# Patient Record
Sex: Male | Born: 1955 | Race: Black or African American | Hispanic: No | Marital: Married | Smoking: Never smoker
Health system: Southern US, Community
[De-identification: ages and names within clinical notes are randomized; demographics above are authoritative.]

## PROBLEM LIST (undated history)

## (undated) DIAGNOSIS — R7303 Prediabetes: Secondary | ICD-10-CM

## (undated) DIAGNOSIS — K429 Umbilical hernia without obstruction or gangrene: Secondary | ICD-10-CM

## (undated) DIAGNOSIS — G4730 Sleep apnea, unspecified: Secondary | ICD-10-CM

## (undated) DIAGNOSIS — Z973 Presence of spectacles and contact lenses: Secondary | ICD-10-CM

## (undated) DIAGNOSIS — H40009 Preglaucoma, unspecified, unspecified eye: Secondary | ICD-10-CM

## (undated) DIAGNOSIS — S3992XA Unspecified injury of lower back, initial encounter: Secondary | ICD-10-CM

## (undated) DIAGNOSIS — E7800 Pure hypercholesterolemia, unspecified: Secondary | ICD-10-CM

## (undated) DIAGNOSIS — B54 Unspecified malaria: Secondary | ICD-10-CM

## (undated) DIAGNOSIS — C61 Malignant neoplasm of prostate: Secondary | ICD-10-CM

## (undated) HISTORY — PX: PROSTATE BIOPSY: SHX241

---

## 2011-03-23 ENCOUNTER — Ambulatory Visit
Admission: RE | Admit: 2011-03-23 | Discharge: 2011-03-23 | Disposition: A | Discharge status: Home / Self Care | Payer: BC Managed Care – PPO | Source: Ambulatory Visit | Attending: Family Medicine | Admitting: Family Medicine

## 2011-03-23 ENCOUNTER — Other Ambulatory Visit: Payer: Self-pay | Admitting: Family Medicine

## 2011-03-23 DIAGNOSIS — M654 Radial styloid tenosynovitis [de Quervain]: Secondary | ICD-10-CM

## 2016-06-17 ENCOUNTER — Other Ambulatory Visit: Payer: Self-pay | Admitting: Family Medicine

## 2016-06-17 ENCOUNTER — Ambulatory Visit
Admission: RE | Admit: 2016-06-17 | Discharge: 2016-06-17 | Disposition: A | Discharge status: Home / Self Care | Payer: BC Managed Care – PPO | Source: Ambulatory Visit | Attending: Family Medicine | Admitting: Family Medicine

## 2016-06-17 DIAGNOSIS — R059 Cough, unspecified: Secondary | ICD-10-CM

## 2016-06-17 DIAGNOSIS — R05 Cough: Secondary | ICD-10-CM

## 2019-06-09 ENCOUNTER — Ambulatory Visit: Payer: BC Managed Care – PPO | Attending: Internal Medicine

## 2019-06-09 DIAGNOSIS — Z23 Encounter for immunization: Secondary | ICD-10-CM | POA: Insufficient documentation

## 2019-06-09 NOTE — Progress Notes (Signed)
   Covid-19 Vaccination Clinic  Name:  Elye Harmsen    MRN: [id number] DOB: 1955/05/04  06/09/2019  Mr. Folker was observed post Covid-19 immunization for 15 minutes without incidence. He was provided with Vaccine Information Sheet and instruction to access the V-Safe system.   Mr. Laura was instructed to call 911 with any severe reactions post vaccine: Marland Kitchen Difficulty breathing  . Swelling of your face and throat  . A fast heartbeat  . A bad rash all over your body  . Dizziness and weakness    Immunizations Administered    Name Date Dose VIS Date Route   Pfizer COVID-19 Vaccine 06/09/2019  6:04 PM 0.3 mL 03/23/2019 Intramuscular   Manufacturer: [employer], [employer]   Lot: [id number]   NDC: [address]-0

## 2019-06-30 ENCOUNTER — Ambulatory Visit: Payer: BC Managed Care – PPO | Attending: Internal Medicine

## 2019-06-30 DIAGNOSIS — Z23 Encounter for immunization: Secondary | ICD-10-CM

## 2019-06-30 NOTE — Progress Notes (Signed)
   Covid-19 Vaccination Clinic  Name:  Andrew Clements    MRN: [id number] DOB: 1955/06/25  06/30/2019  Andrew Clements was observed post Covid-19 immunization for 15 minutes without incident. He was provided with Vaccine Information Sheet and instruction to access the V-Safe system.   Andrew Clements was instructed to call 911 with any severe reactions post vaccine: Marland Kitchen Difficulty breathing  . Swelling of face and throat  . A fast heartbeat  . A bad rash all over body  . Dizziness and weakness   Immunizations Administered    Name Date Dose VIS Date Route   Pfizer COVID-19 Vaccine 06/30/2019  4:59 PM 0.3 mL 03/23/2019 Intramuscular   Manufacturer: [employer], [employer]   Lot: [id number]   NDC: [address]-7

## 2020-10-17 ENCOUNTER — Other Ambulatory Visit (HOSPITAL_COMMUNITY): Payer: Self-pay | Admitting: Family Medicine

## 2020-11-06 ENCOUNTER — Other Ambulatory Visit: Payer: Self-pay

## 2020-11-06 ENCOUNTER — Encounter (HOSPITAL_BASED_OUTPATIENT_CLINIC_OR_DEPARTMENT_OTHER): Payer: Self-pay

## 2020-11-06 ENCOUNTER — Ambulatory Visit (HOSPITAL_BASED_OUTPATIENT_CLINIC_OR_DEPARTMENT_OTHER)
Admission: RE | Admit: 2020-11-06 | Discharge: 2020-11-06 | Disposition: A | Discharge status: Home / Self Care | Payer: BC Managed Care – PPO | Source: Ambulatory Visit | Attending: Family Medicine | Admitting: Family Medicine

## 2020-11-06 DIAGNOSIS — Z136 Encounter for screening for cardiovascular disorders: Secondary | ICD-10-CM | POA: Insufficient documentation

## 2022-02-20 ENCOUNTER — Other Ambulatory Visit: Payer: Self-pay | Admitting: Urology

## 2022-02-20 DIAGNOSIS — R972 Elevated prostate specific antigen [PSA]: Secondary | ICD-10-CM

## 2022-04-01 ENCOUNTER — Ambulatory Visit
Admission: RE | Admit: 2022-04-01 | Discharge: 2022-04-01 | Disposition: A | Discharge status: Home / Self Care | Payer: BC Managed Care – PPO | Source: Ambulatory Visit | Attending: Urology | Admitting: Urology

## 2022-04-01 DIAGNOSIS — R972 Elevated prostate specific antigen [PSA]: Secondary | ICD-10-CM

## 2022-04-01 MED ORDER — GADOPICLENOL 0.5 MMOL/ML IV SOLN
7.0000 mL | Freq: Once | INTRAVENOUS | Status: AC | PRN
  Administered 2022-04-01: 7 mL via INTRAVENOUS

## 2022-06-11 DIAGNOSIS — Z8616 Personal history of COVID-19: Secondary | ICD-10-CM

## 2022-06-11 HISTORY — DX: Personal history of COVID-19: Z86.16

## 2022-06-17 ENCOUNTER — Telehealth: Payer: Self-pay

## 2022-06-17 NOTE — Telephone Encounter (Signed)
Left message for patient to return my call in reference to up coming prostate clinic appointment

## 2022-06-23 ENCOUNTER — Telehealth: Payer: Self-pay

## 2022-06-23 NOTE — Telephone Encounter (Signed)
    1. I confirmed with the patient he is aware of his referral to the clinic 3/19 arriving @ 12:45 pm.    2. I discussed the format of the clinic and the physicians he will be seeing that day.   3. I discussed where the clinic is located and how to contact me.   4. I confirmed his address and informed him I would be mailing a packet of information and forms to be completed. I asked him to bring them with him the day of his appointment.    He voiced understanding of the above. I asked him to call me if he has any questions or concerns regarding his appointments or the forms he needs to complete.

## 2022-06-28 NOTE — Progress Notes (Signed)
RN left message for appointment reminder for upcoming [facility] appointment.

## 2022-06-29 ENCOUNTER — Other Ambulatory Visit: Payer: Self-pay

## 2022-06-29 ENCOUNTER — Ambulatory Visit
Admission: RE | Admit: 2022-06-29 | Discharge: 2022-06-29 | Disposition: A | Discharge status: Home / Self Care | Payer: BC Managed Care – PPO | Source: Ambulatory Visit | Attending: Radiation Oncology | Admitting: Radiation Oncology

## 2022-06-29 ENCOUNTER — Encounter: Payer: Self-pay | Admitting: Urology

## 2022-06-29 ENCOUNTER — Encounter: Payer: Self-pay | Admitting: Radiation Oncology

## 2022-06-29 VITALS — BP 155/76 | HR 64 | Temp 97.0°F | Resp 18 | Ht 72.0 in | Wt 189.4 lb

## 2022-06-29 DIAGNOSIS — C61 Malignant neoplasm of prostate: Secondary | ICD-10-CM | POA: Insufficient documentation

## 2022-06-29 HISTORY — DX: Pure hypercholesterolemia, unspecified: E78.00

## 2022-06-29 HISTORY — DX: Prediabetes: R73.03

## 2022-06-29 HISTORY — DX: Sleep apnea, unspecified: G47.30

## 2022-06-29 HISTORY — DX: Umbilical hernia without obstruction or gangrene: K42.9

## 2022-06-29 HISTORY — DX: Preglaucoma, unspecified, unspecified eye: H40.009

## 2022-06-29 HISTORY — DX: Malignant neoplasm of prostate: C61

## 2022-06-29 HISTORY — DX: Unspecified malaria: B54

## 2022-06-29 HISTORY — DX: Unspecified injury of lower back, initial encounter: S39.92XA

## 2022-06-29 NOTE — Consult Note (Signed)
[facility]     06/29/2022   --------------------------------------------------------------------------------   Andrew Clements  MRN: [id number]  DOB: 1955/06/09, 67 year old Male  SSN:    PRIMARY CARE:  Dibas Koirala, MD  PRIMARY CARE FAX:  [phone]  REFERRING:  Lujean Amel, MD  PROVIDER:  Irine Seal, M.D.  TREATING:  Raynelle Bring, M.D.  LOCATION:  [facility] [phone]     --------------------------------------------------------------------------------   CC/HPI: CC: Prostate Cancer   Physician requesting consult: Dr. Irine Seal  PCP: Dr. Lujean Amel  Location of consult: [facility] - Prostate Cancer Multidisciplinary Clinic   Mr. Coenen is a 67 year old gentleman who found to have a rising PSA of 4.5. This prompted an MRI of the prostate on 04/01/22 that demonstrated a 1.4 cm L mid/apical PI-RADS 4 lesion (ROI 1) and a 1.8 cm R mid/apical PI-RADS 4 lesion (ROI 2). He then proceeded with an ExoDx urine test that confirmed a significant risk for clinically significant prostate cancer (36.29 was the result, high normal is 15.6). He proceeded with an MR/US fusion TRUS biopsy on 05/31/22 that confirmed Gleason 3+4=7 adenocarcinoma of the prostate in 3 out of 3 ROI-1 targeted biopsies. ROI-2 was benign. 5 out of the 12 systematic biopsies were positive for Gleason 3+3=6 adenocarcinoma.   Family history: None.   Imaging studies: MRI (04/01/22): No EPE, SVI, LAD, or bone lesions.   PMH: He has a history of diabetes, hyperlipidemia, and sleep apnea.  PSH: No abdominal surgeries. He does have an umbilical hernia and has been considering having this repaired.   TNM stage: cT1c N0 Mx  PSA: 4.5  Gleason score: 3+4=7 (GG 2)  Biopsy (05/31/22): 8/18 cores positive  Left: L lateral apex (20%, 3+3=6), L mid (10%, 3+3=6), L lateral base (5%, 3+3=6)  Right: R lateral apex (5%, 3+3=6), R mid (10%, 3+3=6)  ROI 1: 3/3 cores, 3+4=7 (20%,  20%, 10%)  ROI 2: HGPIN  Prostate volume: 33.9 cc  PSAD: 0.13   Nomogram  OC disease: 57%  EPE: 42%  SVI: 4%  LNI: 4%  PFS (5 year, 10 year): 84%, 73%   Urinary function: IPSS is 12.  Erectile function: SHIM score is 20.     ALLERGIES: Chloroquine Hcl    MEDICATIONS: Alpha Lipoic Acid  Asafetida  Cinnamon  Latanoprost  Levofloxacin 750 mg tablet 1 po 1 hour prior to the procedure  Magnesium  Melatonin  Milk Thistle  Saw Palmetto  Timolol Maleate  Vitamin B Complex  Vitamin B12  Vitamin C  Zinc     GU PSH: Prostate Needle Biopsy - 05/31/2022     NON-GU PSH: Surgical Pathology, Gross And Microscopic Examination For Prostate Needle - 05/31/2022     GU PMH: Abnormal radiologic findings on diagnostic imaging of other urinary organs - 05/31/2022, - 04/28/2022 Elevated PSA - 05/31/2022, He has an elevated PSA with a low f/t ratio and 2 PIRADS 4 lesions in a 66ml prostate. We discussed options but I have strongly recommended that he have an MR fusion biopsy and he is agreeable. I have reviewed the risks of bleeding, infection and voiding difficulty. Levaquin sent. , - 04/28/2022, His PSA was up over 4 but was down with the subsequent draw. I will repeat the PSA today and if it still remains up, I will get an ExoDx test and consider an MRI. If it is down I will see him in 3 months with a PSA. , -  02/15/2022 BPH w/LUTS, His exam is benign. He has mild/mod LUTS with bothersome nocturia. I recommended he cut out coffee to see if that helps. - 02/15/2022 Nocturia - 02/15/2022    NON-GU PMH: Diabetes Type 2 Hypercholesterolemia Sleep Apnea    FAMILY HISTORY: Bone Cancer - Sister Death In The Family Father - Other Death In The Family Mother - Other Diabetes - Brother High Blood Pressure - Sister   SOCIAL HISTORY: Marital Status: Married Preferred Language: English; Ethnicity: Not Hispanic Or Latino; Race: Black or African American Current Smoking Status: Patient has never  smoked.   Tobacco Use Assessment Completed: Used Tobacco in last 30 days? Does not use smokeless tobacco. Has never drank.  Does not use drugs. Drinks 3 caffeinated drinks per day. 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  Gastrointestinal (Upper):   Patient denies nausea and vomiting.  Gastrointestinal (Lower):   Patient denies constipation and diarrhea.  Constitutional:   Patient denies fever, night sweats, weight loss, and fatigue.  Skin:   Patient denies skin rash/ lesion and itching.  Eyes:   Patient denies blurred vision and double vision.  Ears/ Nose/ Throat:   Patient denies sore throat and sinus problems.  Hematologic/Lymphatic:   Patient denies swollen glands and easy bruising.  Cardiovascular:   Patient denies leg swelling and chest pains.  Respiratory:   Patient denies cough and shortness of breath.  Endocrine:   Patient denies excessive thirst.  Musculoskeletal:   Patient denies back pain and joint pain.  Neurological:   Patient denies headaches and dizziness.  Psychologic:   Patient denies depression and anxiety.   VITAL SIGNS: None   MULTI-SYSTEM PHYSICAL EXAMINATION:    Constitutional: Well-nourished. No physical deformities. Normally developed. Good grooming.     Complexity of Data:  Lab Test Review:   PSA  Records Review:   Pathology Reports, Previous Patient Records  X-Ray Review: MRI Prostate GSORAD: Reviewed Films.     05/17/22 02/15/22 01/01/22 11/10/21 10/17/20  PSA  Total PSA 4.50 ng/mL 4.52 ng/mL 3.86 ng/ml 4.23 ng/ml 2.91 ng/ml  Free PSA 0.49 ng/mL 0.36 ng/mL     % Free PSA 11 % PSA 8 % PSA       PROCEDURES: None   ASSESSMENT:      ICD-10 Details  1 GU:    Prostate Cancer - C61    PLAN:           Document Letter(s):  Created for Patient: Clinical Summary         Notes:   1. Favorable intermediate risk prostate cancer: I had a very long detailed discussion with Mr. Klemens and his wife today regarding his prostate cancer situation. He asked numerous questions and we spent a long time reviewing his situation in detail. The patient was counseled about the natural history of prostate cancer and the standard treatment options that are available for prostate cancer. It was explained to him how his age and life expectancy, clinical stage, Gleason score/prognostic grade group, and PSA (and PSA density) affect his prognosis, the decision to proceed with additional staging studies, as well as how that information influences recommended treatment strategies. We discussed the roles for active surveillance, radiation therapy,  surgical therapy, androgen deprivation, as well as ablative therapy and other investigational options for the treatment of prostate cancer as appropriate to his individual cancer situation. We discussed the risks and benefits of these options with regard to their impact on cancer control and also in terms of potential adverse events, complications, and impact on quality of life particularly related to urinary and sexual function. The patient was encouraged to ask questions throughout the discussion today and all questions were answered to his stated satisfaction. In addition, the patient was provided with and/or directed to appropriate resources and literature for further education about prostate cancer and treatment options. We discussed surgical therapy for prostate cancer including the different available surgical approaches. We discussed, in detail, the risks and expectations of surgery with regard to cancer control, urinary control, and erectile function as well as the expected postoperative recovery process. Additional risks of surgery including  but not limited to bleeding, infection, hernia formation, nerve damage, lymphocele formation, bowel/rectal injury potentially necessitating colostomy, damage to the urinary tract resulting in urine leakage, urethral stricture, and the cardiopulmonary risks such as myocardial infarction, stroke, death, venothromboembolism, etc. were explained. The risk of open surgical conversion for robotic/laparoscopic prostatectomy was also discussed.   He is scheduled to meet with Dr. Tammi Klippel later this afternoon to further discuss his radiotherapy options. He appears a bit overwhelmed by his diagnosis and is unsure of how he would like to proceed.   If he elects to consider surgical therapy, I would have him return for a preoperative exam and likely he will have numerous more questions. He also has an umbilical hernia and that he may wish to consider getting repaired at the same time if he does proceed with surgery.   CC: Dr. Lauretta Grill Koirala  Dr. Irine Seal  Dr. Tyler Pita    E & M CODES: We spent 82 minutes dedicated to evaluation and management time, including face to face interaction, discussions on coordination of care, documentation, result review, and discussion with others as applicable.

## 2022-06-29 NOTE — Progress Notes (Signed)
                               Care Plan Summary  Name: Andrew Clements DOB: 1955-04-24   Your Medical Team:   Urologist -  Dr. Raynelle Bring, [facility]  Radiation Oncologist - Dr. Tyler Pita, [place]     Recommendations: 1) Surgery   2) Radiation    * These recommendations are based on information available as of today's consult.      Recommendations may change depending on the results of further tests or exams.    Next Steps: 1) Consider your options.  Reach out to Kathlee Nations, Art gallery manager, with any questions or treatment decision.     When appointments need to be scheduled, you will be contacted by [facility] and/or [facility].  Questions?  Please do not hesitate to call Katheren Puller, BSN, RN at [phone] with any questions or concerns.  Kathlee Nations is your Oncology Nurse Navigator and is available to assist you while you're receiving your medical care at [facility].

## 2022-06-29 NOTE — Progress Notes (Signed)
Radiation Oncology         [phone] ________________________________  Multidisciplinary Prostate Cancer Clinic  Initial Radiation Oncology Consultation  Name: Andrew Clements MRN: [id number]  Date: 06/29/2022  DOB: Oct 22, 1955  [id number], Dibas, MD  Irine Seal, MD   REFERRING PHYSICIAN: Irine Seal, MD  DIAGNOSIS: 67 y.o. gentleman with stage T1c adenocarcinoma of the prostate with a Gleason's score of 3+4 and a PSA of 4.52    ICD-10-CM   1. Malignant neoplasm of prostate ([place])  C61       HISTORY OF PRESENT ILLNESS::Andrew Clements is a 67 y.o. gentleman with prostate cancer, referred by Dr. Jeffie Pollock.  He was noted to have an elevated PSA of 4.23 by his primary care physician, Dr. Dorthy Cooler.  His previous PSA has been 2.91 in July 2022 so a repeat PSA was performed three weeks later for confirmation and this showed a slight decrease to 3.86.  Accordingly, he was referred for evaluation in urology by Dr. Jeffie Pollock on 02/15/22,  digital rectal examination performed at that time showed no nodules. A repeat PSA obtained that day had increased to 4.52, prompting a prostate MRI which was performed on 04/01/22 and showed PI-RADS 4 lesions in the peripheral zone bilaterally. A urine sample was obtained on 05/13/22 and sent for ExoDX testing showing an elevated risk for high-risk prostate cancer.  Therefore, the patient proceeded to MRI fusion biopsy of the prostate on 05/31/22.  The prostate volume measured 33.87 cc.  Out of 18 core biopsies, 8 were positive.  The maximum Gleason score was 3+4, and this was seen in all three samples from ROI #1 (located on the left). Additionally, Gleason 3+3 was seen in left base lateral, left apex lateral, left mid, right mid, and right apex lateral (all involving 20% or less of each core).      The patient reviewed the biopsy results with his urologist and he has kindly been referred today to the multidisciplinary prostate cancer clinic for presentation of pathology and  radiology studies in our conference for discussion of potential radiation treatment options and clinical evaluation.  PREVIOUS RADIATION THERAPY: No  PAST MEDICAL HISTORY:  has a past medical history of Back injury, Borderline glaucoma, High cholesterol, Malaria, Prediabetes, Prostate cancer ([place]), Sleep apnea, and Umbilical hernia.    PAST SURGICAL HISTORY: Past Surgical History:  Procedure Laterality Date   PROSTATE BIOPSY      FAMILY HISTORY: family history includes Bone cancer in his sister.  SOCIAL HISTORY:  reports that he has never smoked. He has never used smokeless tobacco. He reports that he does not drink alcohol and does not use drugs.  ALLERGIES: Chloroquine phosphate  MEDICATIONS:  Current Outpatient Medications  Medication Sig Dispense Refill   OneTouch Delica Lancets [id number] MISC as directed To obtain blood once a day for 90 days     Acetylcysteine (N-ACETYL-L-CYSTEINE PO) 1 capsule with a meal Orally Once a day     ALPHA LIPOIC ACID PO 1 tablet Orally once a day     Ascorbic Acid (VITAMIN C) 500 MG CAPS 1 capsule Orally Once a day     cholecalciferol (VITAMIN D3) 25 MCG (1000 UNIT) tablet 2 tablets Orally Once a day     latanoprost (XALATAN) 0.005 % ophthalmic solution 1 drop daily.     Magnesium 300 MG CAPS 1 capsule with a meal Orally Once a day     Milk Thistle 500 MG CAPS as directed Orally     Multiple Vitamins-Minerals (MULTIVITAMIN ADULT  EXTRA C PO) 1 tablet by mouth Once daily     Saw Palmetto 450 MG CAPS as directed Orally     timolol (TIMOPTIC) 0.5 % ophthalmic solution Place 1 drop into the left eye every morning.     Zinc 100 MG TABS 1 tablet Orally Once a day     No current facility-administered medications for this encounter.    REVIEW OF SYSTEMS:  On review of systems, the patient reports that he is doing well overall. He denies any chest pain, shortness of breath, cough, fevers, chills, night sweats, unintended weight changes. He denies any bowel  disturbances, and denies abdominal pain, nausea or vomiting. He denies any new musculoskeletal or joint aches or pains. His IPSS was 12, indicating mild to moderate urinary symptoms. His SHIM was 20, indicating he has mild erectile dysfunction. A complete review of systems is obtained and is otherwise negative.   PHYSICAL EXAM:  Wt Readings from Last 3 Encounters:  06/29/22 189 lb 6 oz (85.9 kg)   Temp Readings from Last 3 Encounters:  06/29/22 (!) 97 F (36.1 C) (Temporal)   BP Readings from Last 3 Encounters:  06/29/22 (!) 155/76   Pulse Readings from Last 3 Encounters:  06/29/22 64    /10  In general this is a well appearing African-American man in no acute distress. He's alert and oriented x4 and appropriate throughout the examination. Cardiopulmonary assessment is negative for acute distress and he exhibits normal effort.    KPS = 100  100 - Normal; no complaints; no evidence of disease. 90   - Able to carry on normal activity; minor signs or symptoms of disease. 80   - Normal activity with effort; some signs or symptoms of disease. 72   - Cares for self; unable to carry on normal activity or to do active work. 60   - Requires occasional assistance, but is able to care for most of his personal needs. 50   - Requires considerable assistance and frequent medical care. 86   - Disabled; requires special care and assistance. 57   - Severely disabled; hospital admission is indicated although death not imminent. 5   - Very sick; hospital admission necessary; active supportive treatment necessary. 10   - Moribund; fatal processes progressing rapidly. 0     - Dead  Karnofsky DA, Abelmann WH, Craver LS and Burchenal JH [phone]) The use of the nitrogen mustards in the palliative treatment of carcinoma: with particular reference to bronchogenic carcinoma Cancer 1 634-56   LABORATORY DATA:  No results found for: "WBC", "HGB", "HCT", "MCV", "PLT" No results found for: "NA", "K", "CL",  "CO2" No results found for: "ALT", "AST", "GGT", "ALKPHOS", "BILITOT"   RADIOGRAPHY: No results found.    IMPRESSION/PLAN: 67 y.o. gentleman with Stage T1c adenocarcinoma of the prostate with a Gleason score of 3+4 and a PSA of 4.52.    We discussed the patient's workup and outlined the nature of prostate cancer in this setting. The patient's T stage, Gleason's score, and PSA put him into the intermediate risk group. Accordingly, he is eligible for a variety of potential treatment options including active surveillance, brachytherapy, 5.5 weeks of external radiation, or prostatectomy. We discussed the available radiation techniques, and focused on the details and logistics of delivery. We discussed and outlined the risks, benefits, short and long-term effects associated with radiotherapy and compared and contrasted these with prostatectomy. We discussed the role of SpaceOAR gel in reducing the rectal toxicity associated with radiotherapy.  He appears to have a good understanding of his disease and our treatment recommendations which are of curative intent.  He was encouraged to ask questions that were answered to his stated satisfaction.  At the end of the conversation the patient remains undecided regarding his final treatment preference and prefers to take some additional time to consider his options.  He has our contact information and will let us know once he has reached a final decision so that we can proceed with treatment planning accordingly at that time.  We enjoyed meeting him and his wife today and look forward to continuing to participate in his care.  They know that they are welcome to call at anytime with any further questions or concerns related to the radiation treatment options discussed today.  We personally spent 60 minutes in this encounter including chart review, reviewing radiological studies, meeting face-to-face with the patient, entering orders and completing  documentation.    Nicholos Johns, PA-C    Tyler Pita, MD  [facility] Direct Dial: [phone]  Fax: [phone] [email or web address]  Skype  [email or web address]   This document serves as a record of services personally performed by Tyler Pita, MD and Freeman Caldron, PA-C. It was created on their behalf by Wilburn Mylar, a trained medical scribe. The creation of this record is based on the scribe's personal observations and the provider's statements to them. This document has been checked and approved by the attending provider.

## 2022-07-09 IMAGING — CT CT CARDIAC CORONARY ARTERY CALCIUM SCORE
3 series · 14 of 20 positions shown, 16 images · non-contrast
Comparison: 06/17/2016 chest radiograph
COMPARISON: 06/17/2016 chest radiograph

Addendum:
EXAM:
OVER-READ INTERPRETATION  CT CHEST

The following report is an over-read performed by radiologist Dr.
Ulbio Manuel Dennisse [REDACTED] on 11/06/2020. This over-read
does not include interpretation of cardiac or coronary anatomy or
pathology. The calcium score interpretation by the cardiologist is
attached.
TECHNIQUE: The patient was scanned on a Siemens Force scanner. Axial
non-contrast 3 mm slices were carried out through the heart. The
data set was analyzed on a dedicated work station and scored using
the Agatson method.

[Series 2: casc 3.0 best diast 71 % (id) · axial · 0.33mm/px · z∈[+1290,+1371]mm · 4 of 47 slices shown]
[im 10/47  vessel]
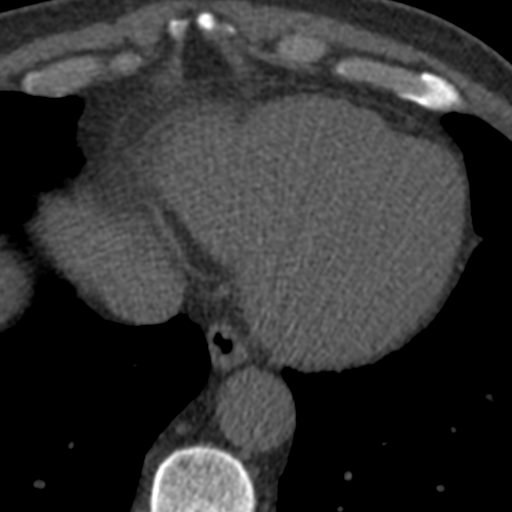
[im 19/47  vessel]
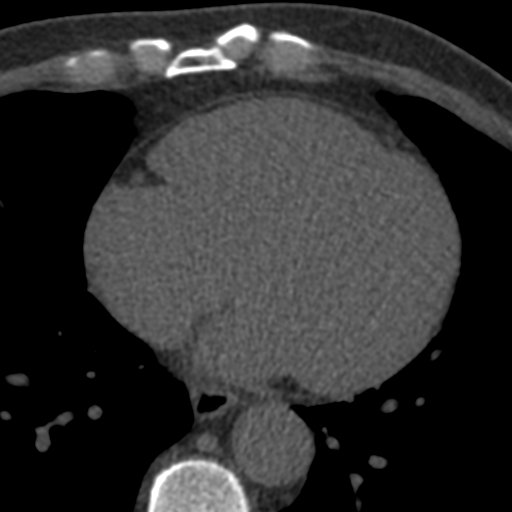
[im 28/47  vessel]
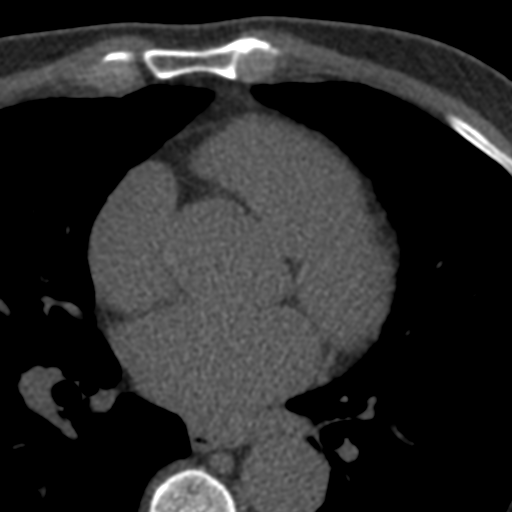
[im 37/47  vessel]
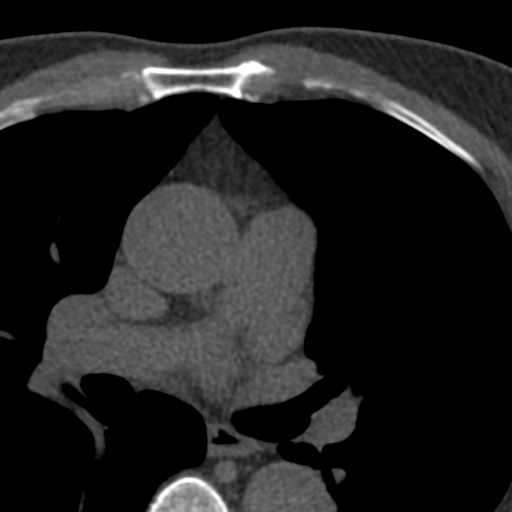

[Series 3: soft full fov 73 % · axial · 0.62mm/px · z∈[+1284,+1377]mm · 5 of 47 slices shown, 7 images]
[im 8/47  vessel]
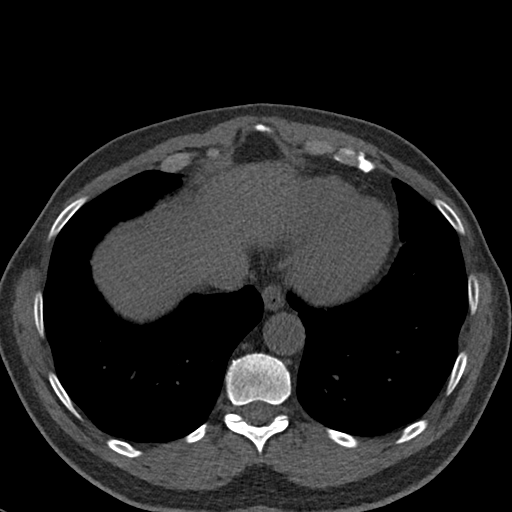
[im 8/47  lung]
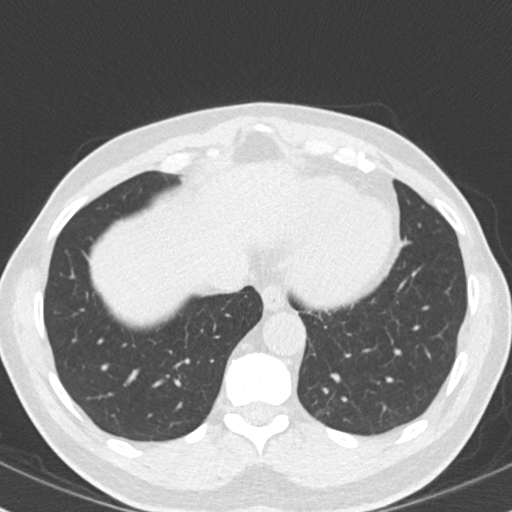
[im 16/47  vessel]
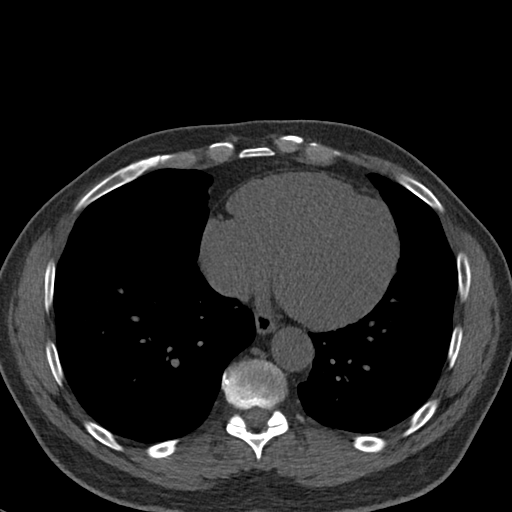
[im 24/47  vessel]
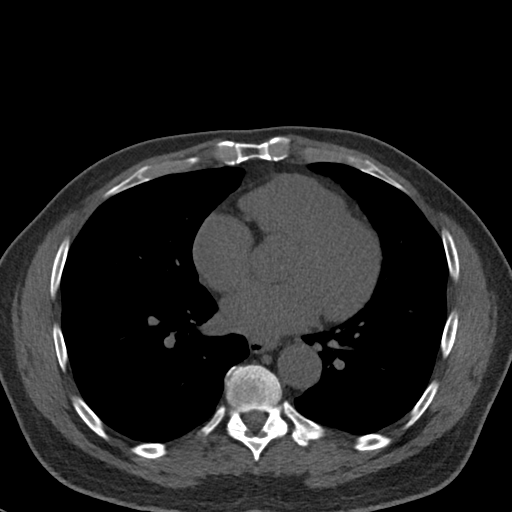
[im 31/47  vessel]
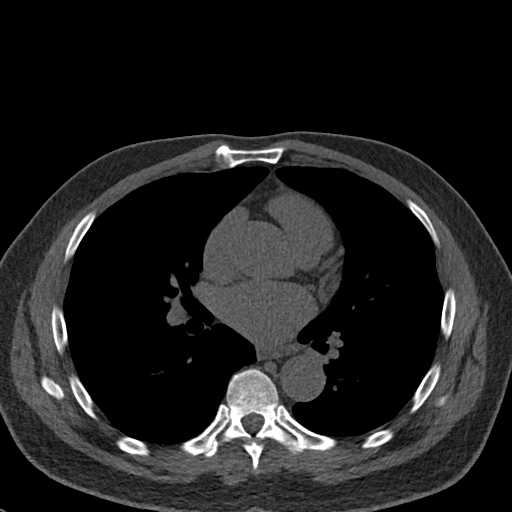
[im 39/47  vessel]
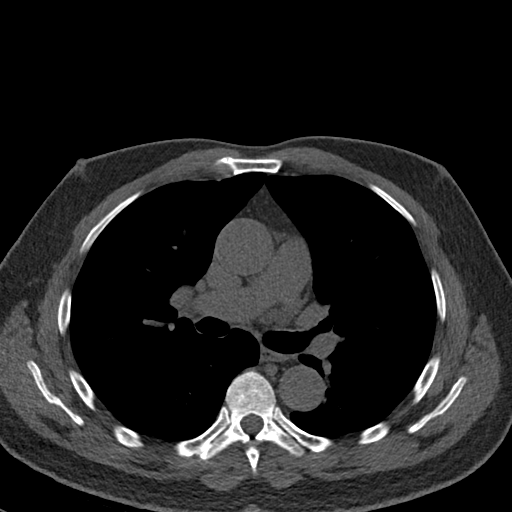
[im 39/47  lung]
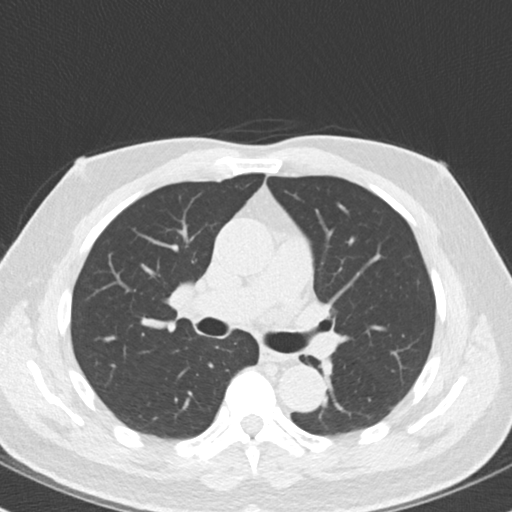

[Series 4: lungs 73 % · axial · 0.62mm/px · z∈[+1284,+1377]mm · 5 of 47 slices shown]
[im 8/47  vessel]
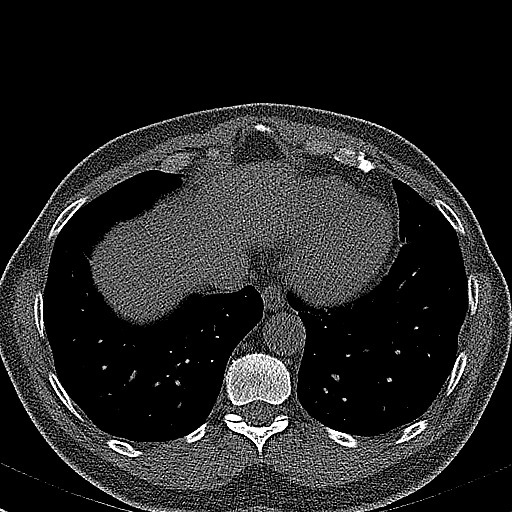
[im 16/47  vessel]
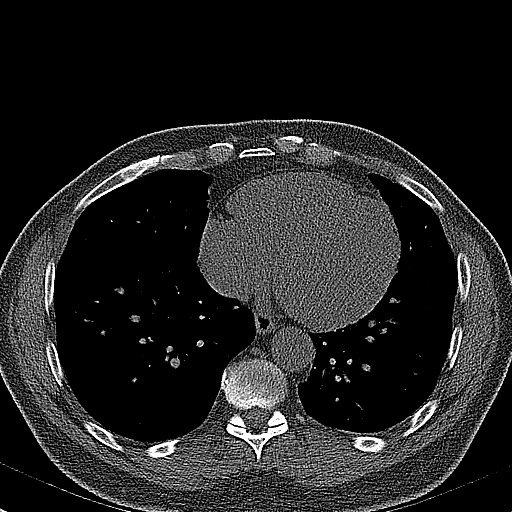
[im 24/47  vessel]
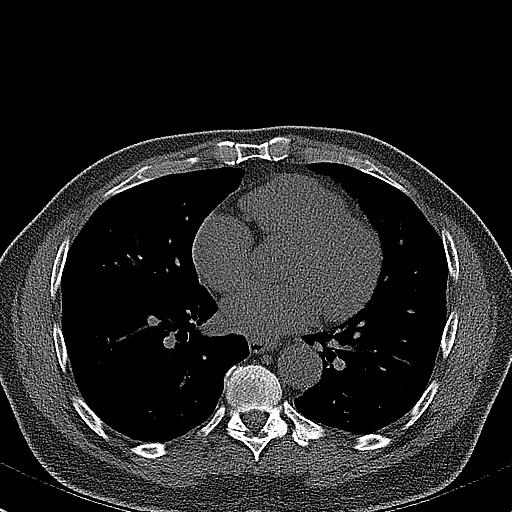
[im 31/47  vessel]
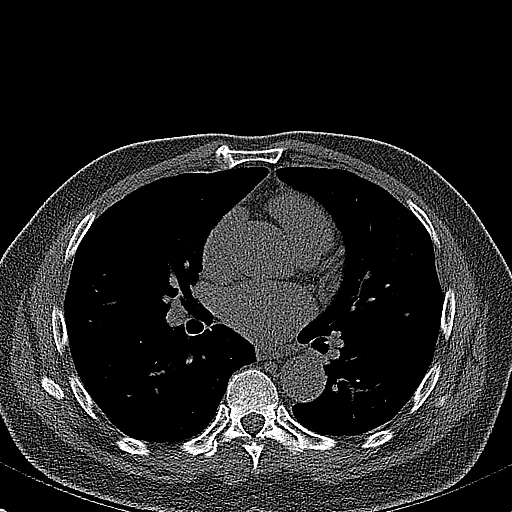
[im 39/47  vessel]
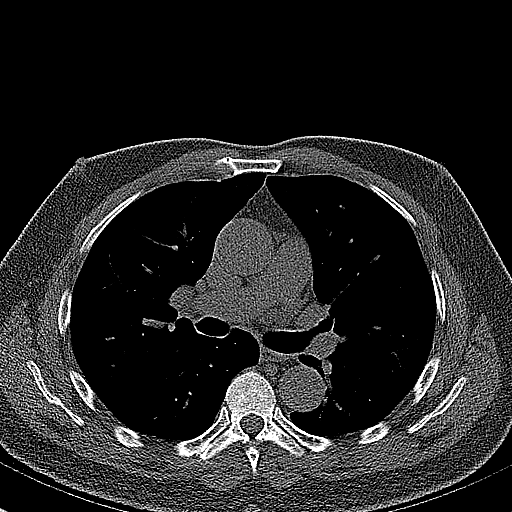

[14 of 20 positions shown; findings below may reference images not displayed]

FINDINGS: Vascular: Normal aortic caliber.

Mediastinum/Nodes: No imaged thoracic adenopathy.

Lungs/Pleura:  3 mm right middle lobe pulmonary nodule on [DATE].

Left major fissure thickening versus a subpleural lymph node on
[DATE].

Upper Abdomen: Normal imaged portions of the liver, spleen.

Musculoskeletal: No acute osseous abnormality.
IMPRESSION: 1.  No acute findings in the imaged extracardiac chest.
2. 3 mm right middle lobe pulmonary nodule. No follow-up needed if
patient is low-risk. Non-contrast chest CT can be considered in 12
months if patient is high-risk. This recommendation follows the
consensus statement: Guidelines for Management of Incidental
Pulmonary Nodules Detected on CT Images: From the [REDACTED]AL DATA:  Risk stratification: 65 Year-old African American
Male

EXAM:
Coronary Calcium Score
FINDINGS: Non-cardiac: See separate report from [REDACTED].

Ascending Aorta: Normal caliber.

Pericardium: Normal.

Coronary arteries: Normal origins.

Coronary Calcium Score:

Left main: 0

Left anterior descending artery: 0

Left circumflex artery: 0

Right coronary artery: 0

Total: 0

Percentile: 1st for age, sex, and race matched control.
IMPRESSION: 1. Coronary calcium score of 0. This was 1st percentile for age,
gender, and race matched controls.

RECOMMENDATIONS:

The proposed cut-off value of 1,651 HELLO yielded a 93 % sensitivity
and 75 % specificity in grading AS severity in patients with
classical low-flow, low-gradient AS. Proposed different cut-off
values to define severe AS for men and women as 2,065 HELLO and 1,274
HELLO, respectively. The joint European and American recommendations
for the assessment of AS consider the aortic valve calcium score as
a continuum - a very high calcium score suggests severe AS and a low
calcium score suggests severe AS is unlikely.

Wyngaard, Xivono, et al. 0559 ESC/EACTS Guidelines for
the management of valvular heart disease. Eur Heart J
0559;[DATE].



If CAC = 0, it is reasonable to withhold statin therapy and reassess
in 5 to 10 years, as long as higher risk conditions are absent
(diabetes mellitus, family history of premature CHD in first degree
relatives (males <55 years; females <65 years), cigarette smoking,
LDL >=190 mg/dL or other independent risk factors).

If CAC is 1 to 99, it is reasonable to initiate statin therapy for
patients >=55 years of age.

If CAC is >=100 or >=75th percentile, it is reasonable to initiate
statin therapy at any age.

Cardiology referral should be considered for patients with CAC
scores =400 or >=75th percentile.

*0955 AHA/ACC/AACVPR/AAPA/ABC/BOHORA/CHRISTOFORUS/CAPROS/Jd/JASFRESH/MARTINE/ZEINAB
Guideline on the Management of Blood Cholesterol: A Report of the
American College of Cardiology/American Heart Association Task Force
on Clinical Practice Guidelines. J Am Coll Cardiol.
1152;73(24):6061-6740.

*** End of Addendum ***
EXAM:
OVER-READ INTERPRETATION  CT CHEST

The following report is an over-read performed by radiologist Dr.
Ulbio Manuel Dennisse [REDACTED] on 11/06/2020. This over-read
does not include interpretation of cardiac or coronary anatomy or
pathology. The calcium score interpretation by the cardiologist is
attached.
FINDINGS: Vascular: Normal aortic caliber.

Mediastinum/Nodes: No imaged thoracic adenopathy.

Lungs/Pleura:  3 mm right middle lobe pulmonary nodule on [DATE].

Left major fissure thickening versus a subpleural lymph node on
[DATE].

Upper Abdomen: Normal imaged portions of the liver, spleen.

Musculoskeletal: No acute osseous abnormality.
IMPRESSION: 1.  No acute findings in the imaged extracardiac chest.
2. 3 mm right middle lobe pulmonary nodule. No follow-up needed if
patient is low-risk. Non-contrast chest CT can be considered in 12
months if patient is high-risk. This recommendation follows the
consensus statement: Guidelines for Management of Incidental
Pulmonary Nodules Detected on CT Images: From the [HOSPITAL]

## 2022-07-09 NOTE — Progress Notes (Unsigned)
RN left voicemail for follow up to assess any additional questions or decisions since [facility] on 3/19.   Patient is scheduled for a Rad Onc Consult at [place] on 4/10.   RN will plan to follow up after this appointment to ensure treatment decision.

## 2022-07-23 NOTE — Progress Notes (Signed)
RN spoke with patient and provided extensive education regarding radiation options available for his stage T1c adenocarcinoma of the prostate with a Gleason's score of 3+4 and a PSA of 4.52 after seeing Dr. Kathrynn Running on 3/19.    Patient would like some additional time to think of options, and would like to speak with someone who has been through radiation.  RN provided information on the support group to allow for a time to connect.  Verbalized understanding.  Patient will contact this RN once he reviews his options and finalizes decision.   Will continue to follow.

## 2022-07-29 NOTE — Progress Notes (Signed)
Patient was presented at the [facility] on 3/19 for his stage T1c adenocarcinoma of the prostate with a Gleason's score of 3+4 and a PSA of 4.52.   Patient has decided to proceed with 5.5 weeks of daily radiation.    MD's notified, plan of care in progress.

## 2022-08-02 ENCOUNTER — Other Ambulatory Visit: Payer: Self-pay | Admitting: Urology

## 2022-08-02 ENCOUNTER — Telehealth: Payer: Self-pay | Admitting: *Deleted

## 2022-08-02 NOTE — Telephone Encounter (Signed)
CALLED PATIENT TO INFORM OF FID. MARKER AND SPACE OAR PLACEMENT ON 10-07-22 AN D HIS SIM ON 10-15-22- ARRIVAL TIME- 9:45 AM @  [facility], INFORMED PATIENT TO ARRIVE WITH A FULL BLADDER

## 2022-08-03 ENCOUNTER — Other Ambulatory Visit: Payer: Self-pay | Admitting: Urology

## 2022-08-10 NOTE — Progress Notes (Signed)
RN spoke with patient and provided extensive education on upcoming appointments and what each appointment is for.  Patient verbalized confusion on many things due to having multiple providers.  RN provided information verbally and provided a written document to help patient understand each provider and how they are involved in his cancer care.    Pt is aware of each appointment, time, and location.  RN will continue to follow to ensure patient does not have additional barriers to care.    Plan of care in progress.

## 2022-08-27 ENCOUNTER — Encounter (HOSPITAL_BASED_OUTPATIENT_CLINIC_OR_DEPARTMENT_OTHER): Payer: Self-pay | Admitting: Urology

## 2022-08-27 ENCOUNTER — Other Ambulatory Visit: Payer: Self-pay

## 2022-08-27 NOTE — Progress Notes (Addendum)
Spoke w/ via phone for pre-op interview---pt Lab needs dos---- none              Lab results------none COVID test -----patient states asymptomatic no test needed Arrive at -------600 am NPO after MN NO Solid Food.  Clear liquids from MN until---500 am Med rec completed Medications to take morning of surgery -----timolol eye drop Diabetic medication -----n/a Patient instructed no nail polish to be worn day of surgery Patient instructed to bring photo id and insurance card day of surgery Patient aware to have Driver (ride ) / caregiver  will arrange   for 24 hours after surgery  Patient Special Instructions -----fleets enema am of surgery Pre-Op special Instructions ----- Patient verbalized understanding of instructions that were given at this phone interview. Patient denies shortness of breath, chest pain, fever, cough at this phone interview.

## 2022-08-30 NOTE — Progress Notes (Signed)
RN returned call.  Voicemail left for call back.  

## 2022-09-09 NOTE — Progress Notes (Signed)
RN left voicemail with authorization department at [facility] to follow up per patient request for authorization of fiducial's and spaceOAR gel.

## 2022-09-09 NOTE — Progress Notes (Signed)
RN spoke with patient to confirm upcoming procedure date for fiducial's and spaceOAR on 5/31.  RN provided extensive education on procedure.    Patient does have a CT Simulation on 6/6 with Dr. Estelle Grumbles @ [facility] for SBRT treatment to his prostate.    RN will continue to follow.  No additional needs at this time.

## 2022-09-10 ENCOUNTER — Ambulatory Visit (HOSPITAL_BASED_OUTPATIENT_CLINIC_OR_DEPARTMENT_OTHER): Payer: BC Managed Care – PPO | Admitting: Anesthesiology

## 2022-09-10 ENCOUNTER — Encounter (HOSPITAL_BASED_OUTPATIENT_CLINIC_OR_DEPARTMENT_OTHER)
Admission: RE | Disposition: A | Discharge status: Home / Self Care | Payer: Self-pay | Source: Home / Self Care | Attending: Urology

## 2022-09-10 ENCOUNTER — Other Ambulatory Visit: Payer: Self-pay

## 2022-09-10 ENCOUNTER — Encounter (HOSPITAL_BASED_OUTPATIENT_CLINIC_OR_DEPARTMENT_OTHER): Payer: Self-pay | Admitting: Urology

## 2022-09-10 ENCOUNTER — Ambulatory Visit (HOSPITAL_BASED_OUTPATIENT_CLINIC_OR_DEPARTMENT_OTHER)
Admission: RE | Admit: 2022-09-10 | Discharge: 2022-09-10 | Disposition: A | Discharge status: Home / Self Care | Payer: BC Managed Care – PPO | Attending: Urology | Admitting: Urology

## 2022-09-10 DIAGNOSIS — G4730 Sleep apnea, unspecified: Secondary | ICD-10-CM | POA: Diagnosis not present

## 2022-09-10 DIAGNOSIS — C61 Malignant neoplasm of prostate: Secondary | ICD-10-CM | POA: Diagnosis not present

## 2022-09-10 DIAGNOSIS — Z01812 Encounter for preprocedural laboratory examination: Secondary | ICD-10-CM

## 2022-09-10 HISTORY — DX: Presence of spectacles and contact lenses: Z97.3

## 2022-09-10 HISTORY — PX: GOLD SEED IMPLANT: SHX6343

## 2022-09-10 HISTORY — PX: SPACE OAR INSTILLATION: SHX6769

## 2022-09-10 SURGERY — INSERTION, GOLD SEEDS
Anesthesia: Monitor Anesthesia Care | Site: Prostate

## 2022-09-10 MED ORDER — ACETAMINOPHEN 500 MG PO TABS
1000.0000 mg | ORAL_TABLET | Freq: Once | ORAL | Status: AC
  Administered 2022-09-10: 1000 mg via ORAL

## 2022-09-10 MED ORDER — SODIUM CHLORIDE 0.9 % IV SOLN: 250.0000 mL | INTRAVENOUS | Status: DC | PRN

## 2022-09-10 MED ORDER — CEFAZOLIN SODIUM-DEXTROSE 2-4 GM/100ML-% IV SOLN
2.0000 g | Freq: Once | INTRAVENOUS | Status: AC
  Administered 2022-09-10: 2 g via INTRAVENOUS

## 2022-09-10 MED ORDER — ACETAMINOPHEN 325 MG RE SUPP: 650.0000 mg | RECTAL | Status: DC | PRN

## 2022-09-10 MED ORDER — FLEET ENEMA 7-19 GM/118ML RE ENEM: 1.0000 | ENEMA | Freq: Once | RECTAL | Status: DC

## 2022-09-10 MED ORDER — FENTANYL CITRATE (PF) 250 MCG/5ML IJ SOLN
INTRAMUSCULAR | Status: DC | PRN
  Administered 2022-09-10 (×2): 25 ug via INTRAVENOUS

## 2022-09-10 MED ORDER — FENTANYL CITRATE (PF) 100 MCG/2ML IJ SOLN: 25.0000 ug | INTRAMUSCULAR | Status: DC | PRN

## 2022-09-10 MED ORDER — FENTANYL CITRATE (PF) 100 MCG/2ML IJ SOLN
INTRAMUSCULAR | Status: AC
  Filled 2022-09-10: qty 2

## 2022-09-10 MED ORDER — CEFAZOLIN SODIUM-DEXTROSE 2-4 GM/100ML-% IV SOLN
INTRAVENOUS | Status: AC
  Filled 2022-09-10: qty 100

## 2022-09-10 MED ORDER — SODIUM CHLORIDE 0.9% FLUSH: 3.0000 mL | Freq: Two times a day (BID) | INTRAVENOUS | Status: DC

## 2022-09-10 MED ORDER — SODIUM CHLORIDE 0.9% FLUSH: 3.0000 mL | INTRAVENOUS | Status: DC | PRN

## 2022-09-10 MED ORDER — LIDOCAINE HCL 2 % IJ SOLN
INTRAMUSCULAR | Status: DC | PRN
  Administered 2022-09-10: 10 mL

## 2022-09-10 MED ORDER — SODIUM CHLORIDE (PF) 0.9 % IJ SOLN
INTRAMUSCULAR | Status: DC | PRN
  Administered 2022-09-10: 10 mL

## 2022-09-10 MED ORDER — ONDANSETRON HCL 4 MG/2ML IJ SOLN: 4.0000 mg | Freq: Once | INTRAMUSCULAR | Status: DC | PRN

## 2022-09-10 MED ORDER — LIDOCAINE 2% (20 MG/ML) 5 ML SYRINGE
INTRAMUSCULAR | Status: DC | PRN
  Administered 2022-09-10: 40 mg via INTRAVENOUS

## 2022-09-10 MED ORDER — LACTATED RINGERS IV SOLN: INTRAVENOUS | Status: DC

## 2022-09-10 MED ORDER — OXYCODONE HCL 5 MG PO TABS: 5.0000 mg | ORAL_TABLET | ORAL | Status: DC | PRN

## 2022-09-10 MED ORDER — PROPOFOL 10 MG/ML IV BOLUS
INTRAVENOUS | Status: DC | PRN
  Administered 2022-09-10: 20 mg via INTRAVENOUS

## 2022-09-10 MED ORDER — ACETAMINOPHEN 500 MG PO TABS
ORAL_TABLET | ORAL | Status: AC
  Filled 2022-09-10: qty 2

## 2022-09-10 MED ORDER — MORPHINE SULFATE (PF) 4 MG/ML IV SOLN: 2.0000 mg | INTRAVENOUS | Status: DC | PRN

## 2022-09-10 MED ORDER — PROPOFOL 500 MG/50ML IV EMUL
INTRAVENOUS | Status: DC | PRN
  Administered 2022-09-10: 180 ug/kg/min via INTRAVENOUS

## 2022-09-10 MED ORDER — PROPOFOL 1000 MG/100ML IV EMUL
INTRAVENOUS | Status: AC
  Filled 2022-09-10: qty 200

## 2022-09-10 MED ORDER — AMISULPRIDE (ANTIEMETIC) 5 MG/2ML IV SOLN: 10.0000 mg | Freq: Once | INTRAVENOUS | Status: DC | PRN

## 2022-09-10 MED ORDER — ACETAMINOPHEN 325 MG PO TABS: 650.0000 mg | ORAL_TABLET | ORAL | Status: DC | PRN

## 2022-09-10 MED ORDER — ONDANSETRON HCL 4 MG/2ML IJ SOLN
INTRAMUSCULAR | Status: DC | PRN
  Administered 2022-09-10: 4 mg via INTRAVENOUS

## 2022-09-10 SURGICAL SUPPLY — 27 items
BLADE CLIPPER SENSICLIP SURGIC (BLADE) ×2 IMPLANT
CNTNR URN SCR LID CUP LEK RST (MISCELLANEOUS) ×2 IMPLANT
CONT SPEC 4OZ STRL OR WHT (MISCELLANEOUS) ×2
COVER BACK TABLE 60X90IN (DRAPES) ×2 IMPLANT
DRSG TEGADERM 4X4.75 (GAUZE/BANDAGES/DRESSINGS) ×2 IMPLANT
DRSG TEGADERM 8X12 (GAUZE/BANDAGES/DRESSINGS) ×2 IMPLANT
GAUZE SPONGE 4X4 12PLY STRL (GAUZE/BANDAGES/DRESSINGS) ×2 IMPLANT
GAUZE SPONGE 4X4 12PLY STRL LF (GAUZE/BANDAGES/DRESSINGS) IMPLANT
GLOVE ECLIPSE 8.0 STRL XLNG CF (GLOVE) ×2 IMPLANT
GLOVE SURG ORTHO 8.5 STRL (GLOVE) ×2 IMPLANT
GLOVE SURG SS PI 8.0 STRL IVOR (GLOVE) ×2 IMPLANT
IMPL SPACEOAR VUE SYSTEM (Spacer) ×2 IMPLANT
IMPLANT SPACEOAR VUE SYSTEM (Spacer) ×2 IMPLANT
KIT TURNOVER CYSTO (KITS) ×2 IMPLANT
MARKER GOLD PRELOAD 1.2X3 (Urological Implant) ×2 IMPLANT
MARKER SKIN DUAL TIP RULER LAB (MISCELLANEOUS) ×2 IMPLANT
NDL SPNL 22GX3.5 QUINCKE BK (NEEDLE) ×2 IMPLANT
NEEDLE SPNL 22GX3.5 QUINCKE BK (NEEDLE) ×2 IMPLANT
SEED GOLD PRELOAD 1.2X3 (Urological Implant) ×2 IMPLANT
SHEATH ULTRASOUND LF (SHEATH) IMPLANT
SHEATH ULTRASOUND LTX NONSTRL (SHEATH) IMPLANT
SLEEVE SCD COMPRESS KNEE MED (STOCKING) ×2 IMPLANT
SURGILUBE 2OZ TUBE FLIPTOP (MISCELLANEOUS) ×2 IMPLANT
SYR 10ML LL (SYRINGE) IMPLANT
SYR CONTROL 10ML LL (SYRINGE) ×2 IMPLANT
TOWEL OR 17X24 6PK STRL BLUE (TOWEL DISPOSABLE) ×2 IMPLANT
UNDERPAD 30X36 HEAVY ABSORB (UNDERPADS AND DIAPERS) ×2 IMPLANT

## 2022-09-10 NOTE — Anesthesia Preprocedure Evaluation (Addendum)
Anesthesia Evaluation  Patient identified by MRN, date of birth, ID band Patient awake    Reviewed: Allergy & Precautions, NPO status , Patient's Chart, lab work & pertinent test results  Airway Mallampati: III  TM Distance: >3 FB Neck ROM: Full    Dental no notable dental hx.    Pulmonary sleep apnea    Pulmonary exam normal        Cardiovascular negative cardio ROS Normal cardiovascular exam     Neuro/Psych negative neurological ROS  negative psych ROS   GI/Hepatic negative GI ROS, Neg liver ROS,,,  Endo/Other  negative endocrine ROS    Renal/GU negative Renal ROS     Musculoskeletal negative musculoskeletal ROS (+)    Abdominal   Peds  Hematology negative hematology ROS (+)   Anesthesia Other Findings PROSTATE CANCER  Reproductive/Obstetrics                             Anesthesia Physical Anesthesia Plan  ASA: 2  Anesthesia Plan: MAC   Post-op Pain Management:    Induction: Intravenous  PONV Risk Score and Plan: 1 and Ondansetron, Dexamethasone, Propofol infusion and Treatment may vary due to age or medical condition  Airway Management Planned: Simple Face Mask  Additional Equipment:   Intra-op Plan:   Post-operative Plan:   Informed Consent: I have reviewed the patients History and Physical, chart, labs and discussed the procedure including the risks, benefits and alternatives for the proposed anesthesia with the patient or authorized representative who has indicated his/her understanding and acceptance.     Dental advisory given  Plan Discussed with: CRNA  Anesthesia Plan Comments:        Anesthesia Quick Evaluation

## 2022-09-10 NOTE — Op Note (Signed)
Procedure: 1.  Transrectal ultrasound-guided placement of fiducial markers. 2.  Transrectal ultrasound-guided placement of SpaceOAR gel.   Preop diagnosis: Prostate cancer.   Postop diagnosis: Same.   Surgeon: Dr. Bjorn Pippin.   Anesthesia: MAC and local.   Specimen: None   EBL: None.   Drain: None.   Complications: none. . Indications: The patient is a 67 year old male with history of prostate cancer he is to undergo radiation therapy and in preparation is to have fiducial markers and SpaceOAR gel placed.   Procedure: He was given Ancef for antibiotic coverage.  He was taken the operating room was placed in lithotomy position and given sedation as needed.  His perineum was clipped and the scrotum was reflected superiorly with an OpSite.  Perineum was prepped with Betadine solution.   The B and K transrectal ultrasound probe was assembled and inserted after generous lubrication.  The prostate was visualized in the sagittal and transverse planes.   The mid perineum was infiltrated with 10mL of 2% lidocaine from the skin level down to the prostatic apex.   The fiducial markers were then placed under ultrasound guidance into the right base medial prostate, the right apex and the left mid lateral prostate.   The SpaceOAR needle was then placed under ultrasound guidance into the post prostatic fat strip and advanced to the base of the prostate.  Saline was infiltrated and the space expanded appropriately.  No blood was returned with aspiration.   The SpaceOAR Vue gel polymer was then instilled over a 12-seconds.  With excellent distribution underneath the prostate and no evidence of rectal infiltration.  The needle was then removed.   The ultrasound probe was removed and he was taken down from the lithotomy position and moved recovery room in stable condition after the wound was cleansed and a dressing was placed.

## 2022-09-10 NOTE — Discharge Instructions (Addendum)
  Post Anesthesia Home Care Instructions  Activity: Get plenty of rest for the remainder of the day. A responsible adult should stay with you for 24 hours following the procedure.  For the next 24 hours, DO NOT: -Drive a car -Advertising copywriter -Drink alcoholic beverages -Take any medication unless instructed by your physician -Make any legal decisions or sign important papers.  Meals: Start with liquid foods such as gelatin or soup. Progress to regular foods as tolerated. Avoid greasy, spicy, heavy foods. If nausea and/or vomiting occur, drink only clear liquids until the nausea and/or vomiting subsides. Call your physician if vomiting continues.  Special Instructions/Symptoms: Your throat may feel dry or sore from the anesthesia or the breathing tube placed in your throat during surgery. If this causes discomfort, gargle with warm salt water. The discomfort should disappear within 24 hours.    May take Tylenol after 1pm if needed for discomfort.

## 2022-09-10 NOTE — Anesthesia Postprocedure Evaluation (Signed)
Anesthesia Post Note  Patient: Andrew Clements  Procedure(s) Performed: GOLD SEED IMPLANT (Prostate) SPACE OAR INSTILLATION (Perineum)     Patient location during evaluation: PACU Anesthesia Type: MAC Level of consciousness: awake Pain management: pain level controlled Vital Signs Assessment: post-procedure vital signs reviewed and stable Respiratory status: spontaneous breathing, nonlabored ventilation and respiratory function stable Cardiovascular status: blood pressure returned to baseline and stable Postop Assessment: no apparent nausea or vomiting Anesthetic complications: no   No notable events documented.  Last Vitals:  Vitals:   09/10/22 0935 09/10/22 1015  BP:  (!) 144/77  Pulse: (!) 42 (!) 52  Resp: 16 16  Temp:  (!) 36.4 C  SpO2: 100% 100%    Last Pain:  Vitals:   09/10/22 1015  TempSrc:   PainSc: 0-No pain                 Madelyne Millikan P Beva Remund

## 2022-09-10 NOTE — Transfer of Care (Signed)
Immediate Anesthesia Transfer of Care Note  Patient: Andrew Clements  Procedure(s) Performed: GOLD SEED IMPLANT (Prostate) SPACE OAR INSTILLATION (Perineum)  Patient Location: PACU  Anesthesia Type:MAC  Level of Consciousness: drowsy and patient cooperative  Airway & Oxygen Therapy: Patient Spontanous Breathing and Patient connected to face mask oxygen  Post-op Assessment: Report given to RN and Post -op Vital signs reviewed and stable  Post vital signs: Reviewed and stable  Last Vitals:  Vitals Value Taken Time  BP    Temp    Pulse 56 09/10/22 0840  Resp 14 09/10/22 0840  SpO2 100 % 09/10/22 0840  Vitals shown include unvalidated device data.  Last Pain:  Vitals:   09/10/22 0643  TempSrc: Oral  PainSc: 0-No pain      Patients Stated Pain Goal: 4 (09/10/22 1610)  Complications: No notable events documented.

## 2022-09-10 NOTE — H&P (Signed)
CC: Prostate Cancer   Physician requesting consult: Dr. Bjorn Pippin  PCP: Dr. Darrow Bussing  Location of consult: [facility] - [facility]state Cancer [facility]idisciplinary [facility]   Mr. Andrew Clements is a 67 year old gentleman who found to have a rising PSA of 4.5. This prompted an MRI of the prostate on 04/01/22 that demonstrated a 1.4 cm L mid/apical PI-RADS 4 lesion (ROI 1) and a 1.8 cm R mid/apical PI-RADS 4 lesion (ROI 2). He then proceeded with an ExoDx urine test that confirmed a significant risk for clinically significant prostate cancer (36.29 was the result, high normal is 15.6). He proceeded with an MR/US fusion TRUS biopsy on 05/31/22 that confirmed Gleason 3+4=7 adenocarcinoma of the prostate in 3 out of 3 ROI-1 targeted biopsies. ROI-2 was benign. 5 out of the 12 systematic biopsies were positive for Gleason 3+3=6 adenocarcinoma.   Family history: None.   Imaging studies: MRI (04/01/22): No EPE, SVI, LAD, or bone lesions.   PMH: He has a history of diabetes, hyperlipidemia, and sleep apnea.  PSH: No abdominal surgeries. He does have an umbilical hernia and has been considering having this repaired.   TNM stage: cT1c N0 Mx  PSA: 4.5  Gleason score: 3+4=7 (GG 2)  Biopsy (05/31/22): 8/18 cores positive  Left: L lateral apex (20%, 3+3=6), L mid (10%, 3+3=6), L lateral base (5%, 3+3=6)  Right: R lateral apex (5%, 3+3=6), R mid (10%, 3+3=6)  ROI 1: 3/3 cores, 3+4=7 (20%, 20%, 10%)  ROI 2: HGPIN  Prostate volume: 33.9 cc  PSAD: 0.13   Nomogram  OC disease: 57%  EPE: 42%  SVI: 4%  LNI: 4%  PFS (5 year, 10 year): 84%, 73%   Urinary function: IPSS is 12.  Erectile function: SHIM score is 20.   08/27/2022: 68 year old man presents prior to undergoing fiducial markers and SpaceOAR on 09/10/2022. He has a history of diabetes, sleep apnea and high lipids. He reports no changes to his medication list, or allergies. He has not had any chest pain, shortness of breath.     ALLERGIES:  Chloroquine Hcl    MEDICATIONS: Alpha Lipoic Acid  Asafetida  Cinnamon  Latanoprost  Levofloxacin 750 mg tablet 1 po 1 hour prior to the procedure  Magnesium  Melatonin  Milk Thistle  Saw Palmetto  Timolol Maleate  Vitamin B Complex  Vitamin B12  Vitamin C  Zinc     GU PSH: Prostate Needle Biopsy - 05/31/2022     NON-GU PSH: Surgical Pathology, Gross And Microscopic Examination For Prostate Needle - 05/31/2022     GU PMH: Prostate Cancer - 06/29/2022 Abnormal radiologic findings on diagnostic imaging of other urinary organs - 05/31/2022, - 04/28/2022 Elevated PSA - 05/31/2022, He has an elevated PSA with a low f/t ratio and 2 PIRADS 4 lesions in a 35ml prostate. We discussed options but I have strongly recommended that he have an MR fusion biopsy and he is agreeable. I have reviewed the risks of bleeding, infection and voiding difficulty. Levaquin sent. , - 04/28/2022, His PSA was up over 4 but was down with the subsequent draw. I will repeat the PSA today and if it still remains up, I will get an ExoDx test and consider an MRI. If it is down I will see him in 3 months with a PSA. , - 02/15/2022 BPH w/LUTS, His exam is benign. He has mild/mod LUTS with bothersome nocturia. I recommended he cut out coffee to see if that helps. - 02/15/2022 Nocturia -  02/15/2022    NON-GU PMH: Diabetes Type 2 Hypercholesterolemia Sleep Apnea    FAMILY HISTORY: Bone Cancer - Sister Death In The Family Father - Other Death In The Family Mother - Other Diabetes - Brother High Blood Pressure - Sister   SOCIAL HISTORY: Marital Status: Married Preferred Language: English; Ethnicity: Not Hispanic Or Latino; Race: Black or African American Current Smoking Status: Patient has never smoked.   Tobacco Use Assessment Completed: Used Tobacco in last 30 days? Does not use smokeless tobacco. Has never drank.  Does not use drugs. Drinks 3 caffeinated drinks per day. Patient's occupation Holiday representative with the [employer]    REVIEW OF SYSTEMS:    GU Review Male:   Patient denies frequent urination, hard to postpone urination, burning/ pain with urination, get up at night to urinate, leakage of urine, stream starts and stops, trouble starting your stream, have to strain to urinate , erection problems, and penile pain.  Gastrointestinal (Upper):   Patient denies nausea, vomiting, and indigestion/ heartburn.  Gastrointestinal (Lower):   Patient denies diarrhea and constipation.  Constitutional:   Patient denies fever, night sweats, weight loss, and fatigue.  Ears/ Nose/ Throat:   Patient denies sore throat and sinus problems.  Hematologic/Lymphatic:   Patient denies swollen glands and easy bruising.  Cardiovascular:   Patient denies leg swelling and chest pains.  Respiratory:   Patient denies cough and shortness of breath.  Musculoskeletal:   Patient denies joint pain and back pain.  Neurological:   Patient denies headaches and dizziness.  Psychologic:   Patient denies depression and anxiety.   VITAL SIGNS: None   MULTI-SYSTEM PHYSICAL EXAMINATION:    Constitutional: Well-nourished. No physical deformities. Normally developed. Good grooming.  Respiratory: Normal breath sounds. No labored breathing, no use of accessory muscles.   Cardiovascular: Regular rate and rhythm. No murmur, no gallop. Normal temperature, normal extremity pulses, no swelling, no varicosities.   Skin: No paleness, no jaundice, no cyanosis. No lesion, no ulcer, no rash.  Neurologic / Psychiatric: Oriented to time, oriented to place, oriented to person. No depression, no anxiety, no agitation.  Gastrointestinal: No mass, no tenderness, no rigidity, non obese abdomen.     Complexity of Data:  Source Of History:  Patient  Records Review:   Previous Doctor Records, Previous Patient Records  Urine Test Review:   Urinalysis   05/17/22 02/15/22 01/01/22 11/10/21 10/17/20  PSA  Total PSA 4.50 ng/mL 4.52 ng/mL 3.86 ng/ml  4.23 ng/ml 2.91 ng/ml  Free PSA 0.49 ng/mL 0.36 ng/mL     % Free PSA 11 % PSA 8 % PSA       08/27/22 08/27/22  Urinalysis  Urine Appearance Clear  Clear   Urine Color Yellow  Yellow   Urine Glucose Neg mg/dL Neg   Urine Bilirubin Neg mg/dL Neg   Urine Ketones Neg mg/dL Neg   Urine Specific Gravity 1.020  1.020   Urine Blood Neg ery/uL Neg   Urine pH 6.0  6.0   Urine Protein Neg mg/dL Neg   Urine Urobilinogen 0.2 mg/dL 0.2   Urine Nitrites Neg  Neg   Urine Leukocyte Esterase Neg leu/uL Neg    PROCEDURES:          Urinalysis - 81003 Dipstick Dipstick Cont'd  Color: Yellow Bilirubin: Neg  Appearance: Clear Ketones: Neg  Specific Gravity: 1.020 Blood: Neg  pH: 6.0 Protein: Neg  Glucose: Neg Urobilinogen: 0.2    Nitrites: Neg    Leukocyte Esterase: Neg  Notes:      ASSESSMENT:      ICD-10 Details  1 GU:   Prostate Cancer - C61 Chronic, Stable   PLAN:           Document Letter(s):  Created for Patient: Clinical Summary         Notes:   Urinalysis is clear today. He will keep upcoming appointment as scheduled for fiducial markers and SpaceOAR. All questions were answered to the best of my ability.           Next Appointment:      Next Appointment: 09/10/2022 08:30 AM    Appointment Type: Surgery     Location: [facility] [phone]    Provider: Bjorn Pippin, M.D.    Reason for Visit: OP NE FIDUCIAL MARKERS SPACE OAR

## 2022-09-13 ENCOUNTER — Encounter (HOSPITAL_BASED_OUTPATIENT_CLINIC_OR_DEPARTMENT_OTHER): Payer: Self-pay | Admitting: Urology

## 2022-09-16 DIAGNOSIS — Z510 Encounter for antineoplastic radiation therapy: Secondary | ICD-10-CM | POA: Diagnosis not present

## 2022-09-17 ENCOUNTER — Ambulatory Visit: Payer: BC Managed Care – PPO | Admitting: Radiation Oncology

## 2022-09-20 NOTE — Progress Notes (Signed)
RN spoke with patient after his recent visit with [facility] (Dr. Estelle Grumbles).   Patient will have his first treatment next week, and was unsure of last treatment date.   RN provided education on post treatment follow up's.  Pt will contact me once he confirms his treatment dates to ensure we keep him following up with urology appropriately.

## 2022-09-29 DIAGNOSIS — Z510 Encounter for antineoplastic radiation therapy: Secondary | ICD-10-CM | POA: Diagnosis not present

## 2022-10-05 DIAGNOSIS — Z510 Encounter for antineoplastic radiation therapy: Secondary | ICD-10-CM | POA: Diagnosis not present

## 2022-10-07 DIAGNOSIS — Z510 Encounter for antineoplastic radiation therapy: Secondary | ICD-10-CM | POA: Diagnosis not present

## 2022-10-15 ENCOUNTER — Ambulatory Visit: Payer: BC Managed Care – PPO | Admitting: Radiation Oncology

## 2022-10-22 NOTE — Progress Notes (Signed)
Patient was presented to the [facility] on 06/29/22 for his stage T1c adenocarcinoma of the prostate with a Gleason's score of 3+4 and a PSA of 4.52.  Patient proceed with treatment  of SBRT under the care of Dr. Estelle Grumbles at [facility] and had his final radiation treatment on 10/15/22.   RN spoke with patient to confirm his final treatment date of 7/5, and he will follow up with Dr. Estelle Grumbles in approximately 3 weeks.  RN educated patient on post treatment PSA's and will coordinate follow up with urology around early October.

## 2022-11-19 NOTE — Progress Notes (Signed)
Pt is now scheduled for post treatment follow up with Dr. Annabell Howells at [facility] on 01/19/2023 @ 3:15pm.  RN left message with appointment time and date.

## 2023-04-28 DIAGNOSIS — R300 Dysuria: Secondary | ICD-10-CM | POA: Diagnosis not present

## 2023-05-02 DIAGNOSIS — R3915 Urgency of urination: Secondary | ICD-10-CM | POA: Diagnosis not present

## 2023-05-02 DIAGNOSIS — R300 Dysuria: Secondary | ICD-10-CM | POA: Diagnosis not present

## 2023-05-03 DIAGNOSIS — Z923 Personal history of irradiation: Secondary | ICD-10-CM | POA: Diagnosis not present

## 2023-06-06 DIAGNOSIS — H401123 Primary open-angle glaucoma, left eye, severe stage: Secondary | ICD-10-CM | POA: Diagnosis not present

## 2023-06-06 DIAGNOSIS — H04123 Dry eye syndrome of bilateral lacrimal glands: Secondary | ICD-10-CM | POA: Diagnosis not present

## 2023-06-06 DIAGNOSIS — H40011 Open angle with borderline findings, low risk, right eye: Secondary | ICD-10-CM | POA: Diagnosis not present

## 2023-06-06 DIAGNOSIS — E119 Type 2 diabetes mellitus without complications: Secondary | ICD-10-CM | POA: Diagnosis not present

## 2023-08-03 DIAGNOSIS — R3915 Urgency of urination: Secondary | ICD-10-CM | POA: Diagnosis not present

## 2023-08-03 DIAGNOSIS — R300 Dysuria: Secondary | ICD-10-CM | POA: Diagnosis not present

## 2023-08-08 DIAGNOSIS — R3915 Urgency of urination: Secondary | ICD-10-CM | POA: Diagnosis not present

## 2023-08-17 DIAGNOSIS — R350 Frequency of micturition: Secondary | ICD-10-CM | POA: Diagnosis not present

## 2023-08-17 DIAGNOSIS — M6281 Muscle weakness (generalized): Secondary | ICD-10-CM | POA: Diagnosis not present

## 2023-08-29 DIAGNOSIS — H40011 Open angle with borderline findings, low risk, right eye: Secondary | ICD-10-CM | POA: Diagnosis not present

## 2023-08-29 DIAGNOSIS — H04123 Dry eye syndrome of bilateral lacrimal glands: Secondary | ICD-10-CM | POA: Diagnosis not present

## 2023-08-29 DIAGNOSIS — E119 Type 2 diabetes mellitus without complications: Secondary | ICD-10-CM | POA: Diagnosis not present

## 2023-08-29 DIAGNOSIS — H401123 Primary open-angle glaucoma, left eye, severe stage: Secondary | ICD-10-CM | POA: Diagnosis not present

## 2023-08-29 DIAGNOSIS — H2513 Age-related nuclear cataract, bilateral: Secondary | ICD-10-CM | POA: Diagnosis not present

## 2023-09-27 DIAGNOSIS — R350 Frequency of micturition: Secondary | ICD-10-CM | POA: Diagnosis not present

## 2023-09-27 DIAGNOSIS — M6281 Muscle weakness (generalized): Secondary | ICD-10-CM | POA: Diagnosis not present

## 2023-10-04 DIAGNOSIS — R350 Frequency of micturition: Secondary | ICD-10-CM | POA: Diagnosis not present

## 2023-10-04 DIAGNOSIS — M6281 Muscle weakness (generalized): Secondary | ICD-10-CM | POA: Diagnosis not present

## 2023-10-12 DIAGNOSIS — M6281 Muscle weakness (generalized): Secondary | ICD-10-CM | POA: Diagnosis not present

## 2023-10-12 DIAGNOSIS — R350 Frequency of micturition: Secondary | ICD-10-CM | POA: Diagnosis not present

## 2023-11-15 DIAGNOSIS — M6281 Muscle weakness (generalized): Secondary | ICD-10-CM | POA: Diagnosis not present

## 2023-11-15 DIAGNOSIS — R3915 Urgency of urination: Secondary | ICD-10-CM | POA: Diagnosis not present

## 2023-11-15 DIAGNOSIS — R350 Frequency of micturition: Secondary | ICD-10-CM | POA: Diagnosis not present

## 2023-11-25 DIAGNOSIS — Z923 Personal history of irradiation: Secondary | ICD-10-CM | POA: Diagnosis not present

## 2024-01-03 DIAGNOSIS — H40011 Open angle with borderline findings, low risk, right eye: Secondary | ICD-10-CM | POA: Diagnosis not present

## 2024-01-03 DIAGNOSIS — H04123 Dry eye syndrome of bilateral lacrimal glands: Secondary | ICD-10-CM | POA: Diagnosis not present

## 2024-01-03 DIAGNOSIS — H2513 Age-related nuclear cataract, bilateral: Secondary | ICD-10-CM | POA: Diagnosis not present

## 2024-01-03 DIAGNOSIS — H401123 Primary open-angle glaucoma, left eye, severe stage: Secondary | ICD-10-CM | POA: Diagnosis not present
